# Patient Record
Sex: Male | Born: 1975 | Race: White | Hispanic: No
Health system: Southern US, Community
[De-identification: ages and names within clinical notes are randomized; demographics above are authoritative.]

---

## 2007-01-11 ENCOUNTER — Emergency Department (HOSPITAL_COMMUNITY): Admission: EM | Admit: 2007-01-11 | Discharge: 2007-01-11 | Payer: Self-pay | Admitting: Emergency Medicine

## 2008-06-03 ENCOUNTER — Emergency Department: Payer: Self-pay | Admitting: Unknown Physician Specialty

## 2009-02-23 ENCOUNTER — Ambulatory Visit: Payer: Self-pay | Admitting: Family Medicine

## 2009-06-28 ENCOUNTER — Ambulatory Visit: Payer: Self-pay | Admitting: Internal Medicine

## 2009-07-13 ENCOUNTER — Ambulatory Visit: Payer: Self-pay | Admitting: Internal Medicine

## 2009-07-13 ENCOUNTER — Ambulatory Visit: Payer: Self-pay | Admitting: Family Medicine

## 2010-01-02 ENCOUNTER — Ambulatory Visit: Payer: Self-pay

## 2010-02-23 ENCOUNTER — Ambulatory Visit: Payer: Self-pay | Admitting: Internal Medicine

## 2011-02-19 ENCOUNTER — Ambulatory Visit: Payer: Self-pay | Admitting: Internal Medicine

## 2011-03-24 ENCOUNTER — Ambulatory Visit: Payer: Self-pay | Admitting: Internal Medicine

## 2011-03-25 LAB — CEA: CEA: 1.5 ng/mL (ref 0.0–4.7)

## 2011-04-13 ENCOUNTER — Ambulatory Visit: Payer: Self-pay | Admitting: Internal Medicine

## 2011-11-05 ENCOUNTER — Ambulatory Visit: Payer: Self-pay

## 2011-12-14 ENCOUNTER — Ambulatory Visit: Payer: Self-pay

## 2012-02-25 ENCOUNTER — Emergency Department: Payer: Self-pay | Admitting: Unknown Physician Specialty

## 2012-02-25 LAB — CBC
HGB: 14.9 g/dL (ref 13.0–18.0)
MCHC: 32.2 g/dL (ref 32.0–36.0)
MCV: 88 fL (ref 80–100)
Platelet: 197 10*3/uL (ref 150–440)
RDW: 14.4 % (ref 11.5–14.5)
WBC: 11 10*3/uL — ABNORMAL HIGH (ref 3.8–10.6)

## 2012-02-25 LAB — BASIC METABOLIC PANEL
Anion Gap: 10 (ref 7–16)
Calcium, Total: 9.1 mg/dL (ref 8.5–10.1)
Co2: 24 mmol/L (ref 21–32)
Creatinine: 1.1 mg/dL (ref 0.60–1.30)
EGFR (African American): >60
Potassium: 3.5 mmol/L (ref 3.5–5.1)
Sodium: 140 mmol/L (ref 136–145)

## 2012-02-25 LAB — TROPONIN I: Troponin-I: <0.02 ng/mL

## 2012-02-25 LAB — CK TOTAL AND CKMB (NOT AT ARMC): CK-MB: 1.7 ng/mL (ref 0.5–3.6)

## 2021-12-18 ENCOUNTER — Other Ambulatory Visit: Payer: Self-pay | Admitting: *Deleted

## 2021-12-18 DIAGNOSIS — E7800 Pure hypercholesterolemia, unspecified: Secondary | ICD-10-CM

## 2021-12-23 ENCOUNTER — Other Ambulatory Visit: Payer: Self-pay

## 2021-12-23 ENCOUNTER — Emergency Department (HOSPITAL_COMMUNITY): Payer: BC Managed Care – PPO

## 2021-12-23 ENCOUNTER — Emergency Department (HOSPITAL_COMMUNITY)
Admission: EM | Admit: 2021-12-23 | Discharge: 2021-12-23 | Disposition: A | Discharge status: Home / Self Care | Payer: BC Managed Care – PPO | Attending: Emergency Medicine | Admitting: Emergency Medicine

## 2021-12-23 DIAGNOSIS — G43809 Other migraine, not intractable, without status migrainosus: Secondary | ICD-10-CM

## 2021-12-23 DIAGNOSIS — R531 Weakness: Secondary | ICD-10-CM | POA: Diagnosis present

## 2021-12-23 DIAGNOSIS — Z20822 Contact with and (suspected) exposure to covid-19: Secondary | ICD-10-CM | POA: Insufficient documentation

## 2021-12-23 DIAGNOSIS — R202 Paresthesia of skin: Secondary | ICD-10-CM | POA: Diagnosis not present

## 2021-12-23 LAB — I-STAT CHEM 8, ED
BUN: 26 mg/dL — ABNORMAL HIGH (ref 6–20)
Calcium, Ion: 1.13 mmol/L — ABNORMAL LOW (ref 1.15–1.40)
Chloride: 103 mmol/L (ref 98–111)
Creatinine, Ser: 0.7 mg/dL (ref 0.61–1.24)
Glucose, Bld: 364 mg/dL — ABNORMAL HIGH (ref 70–99)
HCT: 44 % (ref 39.0–52.0)
Hemoglobin: 15 g/dL (ref 13.0–17.0)
Potassium: 5 mmol/L (ref 3.5–5.1)
Sodium: 130 mmol/L — ABNORMAL LOW (ref 135–145)
TCO2: 27 mmol/L (ref 22–32)

## 2021-12-23 LAB — CBC
HCT: 42.8 % (ref 39.0–52.0)
Hemoglobin: 15.9 g/dL (ref 13.0–17.0)
MCH: 32.6 pg (ref 26.0–34.0)
MCHC: 37.1 g/dL — ABNORMAL HIGH (ref 30.0–36.0)
MCV: 87.7 fL (ref 80.0–100.0)
Platelets: 192 10*3/uL (ref 150–400)
RBC: 4.88 MIL/uL (ref 4.22–5.81)
RDW: 13.4 % (ref 11.5–15.5)
WBC: 6 10*3/uL (ref 4.0–10.5)
nRBC: 0 % (ref 0.0–0.2)

## 2021-12-23 LAB — URINALYSIS, ROUTINE W REFLEX MICROSCOPIC
Bacteria, UA: NONE SEEN
Bilirubin Urine: NEGATIVE
Glucose, UA: >=500 mg/dL — AB
Hgb urine dipstick: NEGATIVE
Ketones, ur: 20 mg/dL — AB
Leukocytes,Ua: NEGATIVE
Nitrite: NEGATIVE
Protein, ur: NEGATIVE mg/dL
Specific Gravity, Urine: 1.041 — ABNORMAL HIGH (ref 1.005–1.030)
pH: 5 (ref 5.0–8.0)

## 2021-12-23 LAB — COMPREHENSIVE METABOLIC PANEL
ALT: 39 U/L (ref 0–44)
AST: 66 U/L — ABNORMAL HIGH (ref 15–41)
Albumin: 3.6 g/dL (ref 3.5–5.0)
Alkaline Phosphatase: 78 U/L (ref 38–126)
Anion gap: 14 (ref 5–15)
BUN: 15 mg/dL (ref 6–20)
CO2: 18 mmol/L — ABNORMAL LOW (ref 22–32)
Calcium: 8.9 mg/dL (ref 8.9–10.3)
Chloride: 101 mmol/L (ref 98–111)
Creatinine, Ser: 0.78 mg/dL (ref 0.61–1.24)
GFR, Estimated: >60 mL/min (ref >60)
Glucose, Bld: 321 mg/dL — ABNORMAL HIGH (ref 70–99)
Potassium: 5.1 mmol/L (ref 3.5–5.1)
Sodium: 133 mmol/L — ABNORMAL LOW (ref 135–145)
Total Bilirubin: 2.3 mg/dL — ABNORMAL HIGH (ref 0.3–1.2)
Total Protein: 6.6 g/dL (ref 6.5–8.1)

## 2021-12-23 LAB — DIFFERENTIAL
Abs Immature Granulocytes: 0.04 10*3/uL (ref 0.00–0.07)
Basophils Absolute: 0.1 10*3/uL (ref 0.0–0.1)
Basophils Relative: 1 %
Eosinophils Absolute: 0.1 10*3/uL (ref 0.0–0.5)
Eosinophils Relative: 1 %
Immature Granulocytes: 1 %
Lymphocytes Relative: 27 %
Lymphs Abs: 1.6 10*3/uL (ref 0.7–4.0)
Monocytes Absolute: 0.4 10*3/uL (ref 0.1–1.0)
Monocytes Relative: 7 %
Neutro Abs: 3.8 10*3/uL (ref 1.7–7.7)
Neutrophils Relative %: 63 %

## 2021-12-23 LAB — RESP PANEL BY RT-PCR (FLU A&B, COVID) ARPGX2
Influenza A by PCR: NEGATIVE
Influenza B by PCR: NEGATIVE
SARS Coronavirus 2 by RT PCR: NEGATIVE

## 2021-12-23 LAB — RAPID URINE DRUG SCREEN, HOSP PERFORMED
Amphetamines: NOT DETECTED
Barbiturates: NOT DETECTED
Benzodiazepines: NOT DETECTED
Cocaine: NOT DETECTED
Opiates: NOT DETECTED
Tetrahydrocannabinol: NOT DETECTED

## 2021-12-23 LAB — PROTIME-INR
INR: 0.9 (ref 0.8–1.2)
Prothrombin Time: 12.2 seconds (ref 11.4–15.2)

## 2021-12-23 LAB — CBG MONITORING, ED: Glucose-Capillary: 375 mg/dL — ABNORMAL HIGH (ref 70–99)

## 2021-12-23 LAB — APTT: aPTT: 30 seconds (ref 24–36)

## 2021-12-23 LAB — ETHANOL: Alcohol, Ethyl (B): <10 mg/dL (ref <10)

## 2021-12-23 MED ORDER — DIPHENHYDRAMINE HCL 50 MG/ML IJ SOLN
25.0000 mg | Freq: Once | INTRAMUSCULAR | Status: AC
  Administered 2021-12-23: 25 mg via INTRAVENOUS
  Filled 2021-12-23: qty 1

## 2021-12-23 MED ORDER — IOHEXOL 350 MG/ML SOLN
60.0000 mL | Freq: Once | INTRAVENOUS | Status: AC | PRN
  Administered 2021-12-23: 60 mL via INTRAVENOUS

## 2021-12-23 MED ORDER — SODIUM CHLORIDE 0.9 % IV BOLUS
1000.0000 mL | Freq: Once | INTRAVENOUS | Status: AC
  Administered 2021-12-23: 1000 mL via INTRAVENOUS

## 2021-12-23 MED ORDER — METOCLOPRAMIDE HCL 5 MG/ML IJ SOLN
10.0000 mg | Freq: Once | INTRAMUSCULAR | Status: AC
  Administered 2021-12-23: 10 mg via INTRAVENOUS
  Filled 2021-12-23: qty 2

## 2021-12-23 NOTE — Consult Note (Signed)
Neurology Consultation ? ?Reason for Consult: Code Stroke ?Referring Physician: Dr. Dalene SeltzerSchlossman ? ?CC: Left-sided weakness and paresthesias  ? ?History is obtained from: Patient, chart review ? ?HPI: Seth Diaz is a 46 y.o. male with a medical history significant for non-insulin dependent diabetes mellitus who presented to the ED on 3/13 for evaluation of left-sided weakness, left-sided paresthesias, and a recent history of a 5-day left-sided headache. Patient states that when he went to bed at 21:00 last night, he noted that he had an acute onset of tingling of the left side of his body involving his face, arm, and leg with an ongoing 5 day left-sided headache. He woke up at 06:00 this morning with continued left-sided paresthesias. He states that at 06:45 while he was driving to work, he was unable to hold his left hand up to the steering wheel for driving and decided to go to the ER for further evaluation. CBG for EMS was noted to be elevated at 422. ? ?LKW: 21:00 3/12 ?TNK given?: no, patient presented outside of the thrombolytic therapy time window ?IR Thrombectomy? No, patient's presentation is not consistent with an LVO. Vessel imaging reviewed by attending neurologist and radiologist without evidence of an LVO.  ?Modified Rankin Scale: 0-Completely asymptomatic and back to baseline post- stroke ? ?ROS: A complete ROS was performed and is negative except as noted in the HPI.  ? ? ?Social History:  ? has no history on file for tobacco use, alcohol use, and drug use. ? ?Medications ?No current facility-administered medications for this encounter. ?No current outpatient medications on file. ? ?Exam: ?Current vital signs: ?BP (!) 141/86 (BP Location: Right Arm)   Pulse 89   Temp 98.6 ?F (37 ?C) (Oral)   Resp 14   SpO2 96%  ?Vital signs in last 24 hours: ?Temp:  [98.6 ?F (37 ?C)] 98.6 ?F (37 ?C) (03/13 0908) ?Pulse Rate:  [89] 89 (03/13 0908) ?Resp:  [14] 14 (03/13 0908) ?BP: (141)/(86) 141/86 (03/13  0908) ?SpO2:  [96 %-97 %] 96 % (03/13 0908) ? ?GENERAL: Awake, alert, in no acute distress ?Psych: Affect appropriate for situation, patient is calm and cooperative with examination.  ?Head: Normocephalic and atraumatic, without obvious abnormality ?EENT: Normal conjunctivae, dry mucous membranes, no OP obstruction ?LUNGS: Normal respiratory effort. Non-labored breathing on room air ?CV: Regular rate and rhythm on telemetry ?ABDOMEN: Soft, non-tender, non-distended ?Extremities: Warm, well perfused, without obvious deformity ? ?NEURO:  ?Mental Status: Awake, alert, and oriented to person, place, time, and situation. ?He is able to provide a clear and coherent history of present illness. ?Speech/Language: speech is intact without dysarthria.   ?Naming, repetition, fluency, and comprehension intact without aphasia. ?No neglect is noted ?Cranial Nerves:  ?II: PERRL. Visual fields full.  ?III, IV, VI: EOMI without ptosis, nystagmus, or gaze preference.  ?V: Sensation is intact to light touch with decreased sensation to the left face compared to the right.  ?VII: Face is symmetric resting and smiling.  ?VIII: Hearing is intact to voice ?IX, X: Palate elevation is symmetric. Phonation normal.  ?XI: Normal sternocleidomastoid and trapezius muscle strength ?XII: Tongue protrudes midline without fasciculations.   ?Motor: Right upper and lower extremities are able to elevate antigravity without vertical drift. ?Left upper and lower extremities elevate antigravity with minimal vertical drift on assessment.  ?Tone is normal. Bulk is normal.  ?Sensation: Decreased sensation and tingling paresthesias noted on the left face, arm, and leg.  ?Coordination: FTN intact bilaterally. HKS intact bilaterally. ?Gait: Deferred ? ?  NIHSS: ?1a Level of Conscious.: 0 ?1b LOC Questions: 0 ?1c LOC Commands: 0 ?2 Best Gaze: 0 ?3 Visual: 0 ?4 Facial Palsy: 0 ?5a Motor Arm - left: 1 ?5b Motor Arm - Right: 0 ?6a Motor Leg - Left: 1 ?6b Motor Leg -  Right: 0 ?7 Limb Ataxia: 0 ?8 Sensory: 1 ?9 Best Language: 0 ?10 Dysarthria: 0 ?11 Extinct. and Inatten.: 0 ?TOTAL: 2 ? ?Labs ?I have reviewed labs in epic and the results pertinent to this consultation are: ?CBC ?   ?Component Value Date/Time  ? WBC 6.0 12/23/2021 0914  ? RBC 4.88 12/23/2021 0914  ? HGB 15.0 12/23/2021 0922  ? HGB 14.9 02/25/2012 1844  ? HCT 44.0 12/23/2021 0922  ? HCT 46.4 02/25/2012 1844  ? PLT 192 12/23/2021 0914  ? PLT 197 02/25/2012 1844  ? MCV 87.7 12/23/2021 0914  ? MCV 88 02/25/2012 1844  ? MCH 32.6 12/23/2021 0914  ? MCHC 37.1 (H) 12/23/2021 0914  ? RDW 13.4 12/23/2021 0914  ? RDW 14.4 02/25/2012 1844  ? LYMPHSABS 1.6 12/23/2021 0914  ? MONOABS 0.4 12/23/2021 0914  ? EOSABS 0.1 12/23/2021 0914  ? BASOSABS 0.1 12/23/2021 0914  ? ?CMP  ?   ?Component Value Date/Time  ? NA 130 (L) 12/23/2021 1027  ? NA 140 02/25/2012 1844  ? K 5.0 12/23/2021 0922  ? K 3.5 02/25/2012 1844  ? CL 103 12/23/2021 0922  ? CL 106 02/25/2012 1844  ? CO2 24 02/25/2012 1844  ? GLUCOSE 364 (H) 12/23/2021 2536  ? GLUCOSE 78 02/25/2012 1844  ? BUN 26 (H) 12/23/2021 6440  ? BUN 18 02/25/2012 1844  ? CREATININE 0.70 12/23/2021 0922  ? CREATININE 1.10 02/25/2012 1844  ? CALCIUM 9.1 02/25/2012 1844  ? GFRNONAA >60 02/25/2012 1844  ? GFRAA >60 02/25/2012 1844  ? ?Lipid Panel  ?No results found for: CHOL, TRIG, HDL, CHOLHDL, VLDL, LDLCALC, LDLDIRECT ?No results found for: HGBA1C ? ?Imaging ?I have reviewed the images obtained: ? ?CT-scan of the brain 3/13: ?There is no acute intracranial hemorrhage or evidence of acute infarction. ASPECT score is 10. ?These results were communicated to Dr. Amada Jupiter at 9:28 am on 12/23/2021 by text page via the [facility] messaging system. ? ?CT angio head and neck + CT cerebral perfusion 3/13:  ?No large vessel occlusion, hemodynamically significant stenosis, or evidence of dissection. ? ?Assessment: 46 y.o. male who presented to the ED 3/13 for evaluation of left-sided paresthesias and left-sided  weakness. Patient was last known well prior to bed last night at 21:00 when he had acute onset of left-sided tingling and decreased sensation. Upon driving to work this morning, the patient noted left-sided weakness and presented to the ED for further evaluation. ?- Examination reveals patient with left-sided paresthesias and minimal left-sided weakness. NIHSS of 3. ?- CT imaging is without acute intracranial hemorrhage, LVO, or hemodynamically significant stenosis.  ?- Patient states he does not frequently get headaches but has had a left-sided headache for approximately 5 days prior to hospital arrival.  ?- Presentation is concerning for possible acute ischemic stroke versus less likely complex migraine headache. Will obtain MRI brain for further evaluation. Not a candidate for TNK as his LKW time was 21:00 last night and patient presented to the hospital outside of the thrombolytic therapy window. Not a candidate for IR as presentation and imaging are not consistent with an LVO. ? ?Recommendations: ?- MRI brain wo contrast ?- If MRI brain reveals evidence of acute ischemic stroke, will expand  full stroke work up at that time ? ?Lanae Boast, AGAC-NP ?[facility] ?Pager: [phone] ? ?I have seen the patient and reviewed the above note.  There are several components to his history that are unusual for acute ischemia, including the progressive nature of his symptoms, the presence of positive symptoms (paresthesia), but given that he has not had the symptoms before I think that further evaluation with MRI would be prudent.  If this is negative, I think that complicated migraine is a significant possibility. ? ?He does report a history of occasional headaches, but has never identified them as migraines in the past. ? ?At this time, I would favor getting an MRI of his brain, and if negative treating this as complicated migraine. ? ?Ritta Slot, MD ?[facility] ?[phone] ? ?If  7pm- 7am, please page neurology on call as listed in AMION. ? ?

## 2021-12-23 NOTE — Code Documentation (Signed)
Stroke Response Nurse Documentation ?Code Documentation ? ?DEMONIE KASSA is a 46 y.o. male arriving to [facility]  via [facility] on 12/23/2021 with past medical hx of non-insulin dependent diabetes. CBG with EMS 422. CBG in ED 375. Code stroke was activated by ED.  ? ?Patient from home where he was LKW at 1900 12/22/2021 when he noted tingling on left side. Woke up this morning to increase in tingling. Attempted to drive to work and had difficulty with left arm on the steering wheel. Reports recent left sided headache that has now resolved, but complains of dull feeling on left side of head. Also reports intermittently leaning to the side when ambulating for the past few weeks.   ? ?Stroke team met patient in CT, cleared by Dr. Billy Fischer. NIHSS 3, see documentation for details and code stroke times. Patient with left arm weakness, left leg weakness, and left decreased sensation on exam. The following imaging was completed:  CT Head and CTA. Patient is not a candidate for IV Thrombolytic due to out of window. Patient is not not a candidate for IR due to no LVO.  ? ?Care Plan: Q2h assessments; MRI will determine plan.  ? ?Bedside handoff with ED RN Jacquelyn.   ? ?Leverne Humbles ?Stroke Response RN ? ? ?

## 2021-12-23 NOTE — ED Provider Notes (Signed)
[facility]   CSN: [id number] Arrival date & time: 12/23/21  [id number]  An emergency department physician performed an initial assessment on this suspected stroke patient at 0903.  History  Chief Complaint  Patient presents with   Headache   Numbness    Seth Diaz is a 46 y.o. male with a past medical history of NIDDM presenting to the ED with a chief complaint of weakness.  States that he was in his usual state of health when he went to sleep at 9 PM yesterday.  He woke up this morning and noticed that "something felt off, and it was tingling" about the left side of his body.  At approximately 6:45 AM he was driving to work when he noticed weakness in his left arm to the point where he "could not even lift up my arm to drive."  He states that the symptoms have been persistent since they began.  He does admit that he has had a left-sided headache for the past few days and has intermittently been taking Tylenol.  He also noticed abnormal gait and "leaning" to the side when he ambulates for the past few weeks.  He denies history of similar symptoms in the past.  He denies any vision changes, loss of vision, numbness, chest pain, shortness of breath, injuries or falls, history of stroke or hemorrhage.   Headache Associated symptoms: weakness   Associated symptoms: no abdominal pain, no cough, no diarrhea, no dizziness, no ear pain, no fever, no myalgias, no nausea, no photophobia, no sore throat and no vomiting       Home Medications Prior to Admission medications   Not on File      Allergies    Patient has no known allergies.    Review of Systems   Review of Systems  Constitutional:  Negative for appetite change, chills and fever.  HENT:  Negative for ear pain, rhinorrhea, sneezing and sore throat.   Eyes:  Negative for photophobia and visual disturbance.  Respiratory:  Negative for cough, chest tightness, shortness of breath  and wheezing.   Cardiovascular:  Negative for chest pain and palpitations.  Gastrointestinal:  Negative for abdominal pain, blood in stool, constipation, diarrhea, nausea and vomiting.  Genitourinary:  Negative for dysuria, hematuria and urgency.  Musculoskeletal:  Negative for myalgias.  Skin:  Negative for rash.  Neurological:  Positive for weakness and headaches. Negative for dizziness and light-headedness.   Physical Exam Updated Vital Signs BP 123/83    Pulse 80    Temp 98.8 F (37.1 C) (Oral)    Resp 18    Ht 6' (1.829 m)    Wt 98.9 kg    SpO2 96%    BMI 29.57 kg/m  Physical Exam Vitals and nursing note reviewed.  Constitutional:      General: He is not in acute distress.    Appearance: He is well-developed.  HENT:     Head: Normocephalic and atraumatic.     Nose: Nose normal.  Eyes:     General: No scleral icterus.       Left eye: No discharge.     Conjunctiva/sclera: Conjunctivae normal.  Cardiovascular:     Rate and Rhythm: Normal rate and regular rhythm.     Heart sounds: Normal heart sounds. No murmur heard.   No friction rub. No gallop.  Pulmonary:     Effort: Pulmonary effort is normal. No respiratory distress.     Breath sounds:  Normal breath sounds.  Abdominal:     General: Bowel sounds are normal. There is no distension.     Palpations: Abdomen is soft.     Tenderness: There is no abdominal tenderness. There is no guarding.  Musculoskeletal:        General: Normal range of motion.     Cervical back: Normal range of motion and neck supple.  Skin:    General: Skin is warm and dry.     Findings: No rash.  Neurological:     General: No focal deficit present.     Mental Status: He is alert and oriented to person, place, and time.     Sensory: No sensory deficit.     Motor: Weakness present. No abnormal muscle tone.     Coordination: Coordination normal.     Comments: Strength 3/5 of left upper and lower extremity.  Normal sensation to light touch of  bilateral upper and lower extremities.  No facial asymmetry noted.  Visual fields are intact.  Symmetrical tongue protrusion.  No aphasia.    ED Results / Procedures / Treatments   Labs (all labs ordered are listed, but only abnormal results are displayed) Labs Reviewed  CBC - Abnormal; Notable for the following components:      Result Value   MCHC 37.1 (*)    All other components within normal limits  URINALYSIS, ROUTINE W REFLEX MICROSCOPIC - Abnormal; Notable for the following components:   Specific Gravity, Urine 1.041 (*)    Glucose, UA >=500 (*)    Ketones, ur 20 (*)    All other components within normal limits  COMPREHENSIVE METABOLIC PANEL - Abnormal; Notable for the following components:   Sodium 133 (*)    CO2 18 (*)    Glucose, Bld 321 (*)    AST 66 (*)    Total Bilirubin 2.3 (*)    All other components within normal limits  I-STAT CHEM 8, ED - Abnormal; Notable for the following components:   Sodium 130 (*)    BUN 26 (*)    Glucose, Bld 364 (*)    Calcium, Ion 1.13 (*)    All other components within normal limits  CBG MONITORING, ED - Abnormal; Notable for the following components:   Glucose-Capillary 375 (*)    All other components within normal limits  RESP PANEL BY RT-PCR (FLU A&B, COVID) ARPGX2  ETHANOL  PROTIME-INR  APTT  DIFFERENTIAL  RAPID URINE DRUG SCREEN, HOSP PERFORMED    EKG EKG Interpretation  Date/Time:  Monday December 23 2021 09:08:02 EDT Ventricular Rate:  79 PR Interval:  165 QRS Duration: 98 QT Interval:  383 QTC Calculation: 439 R Axis:   81 Text Interpretation: Sinus rhythm No significant change since last tracing Confirmed by Alvira Monday ([id number]) on 12/23/2021 10:31:21 AM  Radiology MR BRAIN WO CONTRAST  Result Date: 12/23/2021 CLINICAL DATA:  Neuro deficit with acute stroke suspected EXAM: MRI HEAD WITHOUT CONTRAST TECHNIQUE: Multiplanar, multiecho pulse sequences of the brain and surrounding structures were obtained without  intravenous contrast. COMPARISON:  Head CT and CTA from earlier today FINDINGS: Brain: No acute infarction, hemorrhage, hydrocephalus, extra-axial collection or mass lesion. Few remote white matter insults mainly seen in the frontal white matter. No specific demyelinating pattern. Normal brain volume. This is a nonspecific finding which can be seen with migraine, trauma, inflammation, or ischemia. History of diabetes favors early chronic small vessel ischemia. Vascular: Normal flow voids. Skull and upper cervical spine: Normal marrow signal. Sinuses/Orbits:  Negative. IMPRESSION: 1. No acute finding. 2. Few remote white matter insults with nonspecific pattern, diabetic history favoring early small vessel ischemia. Electronically Signed   By: Tiburcio Pea M.D.   On: 12/23/2021 11:49   CT HEAD CODE STROKE WO CONTRAST  Result Date: 12/23/2021 CLINICAL DATA:  Code stroke.  Neuro deficit, acute, stroke suspected EXAM: CT HEAD WITHOUT CONTRAST TECHNIQUE: Contiguous axial images were obtained from the base of the skull through the vertex without intravenous contrast. RADIATION DOSE REDUCTION: This exam was performed according to the departmental dose-optimization program which includes automated exposure control, adjustment of the mA and/or kV according to patient size and/or use of iterative reconstruction technique. COMPARISON:  None. FINDINGS: Brain: There is no acute intracranial hemorrhage, mass effect, or edema. Gray-white differentiation is preserved. There is no extra-axial fluid collection. Ventricles and sulci are within normal limits in size and configuration. Vascular: No hyperdense vessel. Skull: Calvarium is unremarkable. Sinuses/Orbits: No acute finding. Other: None. ASPECTS ([place]berta Stroke Program Early CT Score) - Ganglionic level infarction (caudate, lentiform nuclei, internal capsule, insula, M1-M3 cortex): 7 - Supraganglionic infarction (M4-M6 cortex): 3 Total score (0-10 with 10 being normal): 10  IMPRESSION: There is no acute intracranial hemorrhage or evidence of acute infarction. ASPECT score is 10. These results were communicated to Dr. Amada Jupiter at 9:28 am on 12/23/2021 by text page via the [facility] messaging system. Electronically Signed   By: Guadlupe Spanish M.D.   On: 12/23/2021 09:29   CT ANGIO HEAD NECK W WO CM (CODE STROKE)  Result Date: 12/23/2021 CLINICAL DATA:  Neuro deficit, acute, stroke suspected EXAM: CT ANGIOGRAPHY HEAD AND NECK TECHNIQUE: Multidetector CT imaging of the head and neck was performed using the standard protocol during bolus administration of intravenous contrast. Multiplanar CT image reconstructions and MIPs were obtained to evaluate the vascular anatomy. Carotid stenosis measurements (when applicable) are obtained utilizing NASCET criteria, using the distal internal carotid diameter as the denominator. RADIATION DOSE REDUCTION: This exam was performed according to the departmental dose-optimization program which includes automated exposure control, adjustment of the mA and/or kV according to patient size and/or use of iterative reconstruction technique. CONTRAST:  47mL OMNIPAQUE IOHEXOL 350 MG/ML SOLN COMPARISON:  None. FINDINGS: CTA NECK Aortic arch: Great vessel origins are patent. Right carotid system: Patent.  No stenosis. Left carotid system: Patent.  No stenosis. Vertebral arteries: Patent and codominant.  No stenosis. Skeleton: No significant osseous abnormality. Other neck: Unremarkable. Upper chest: No apical lung mass. Review of the MIP images confirms the above findings CTA HEAD Anterior circulation: Intracranial internal carotid arteries are patent. Anterior and middle cerebral arteries are patent. Posterior circulation: Intracranial vertebral arteries are patent. Basilar artery is patent. Major cerebellar artery origins are patent. Bilateral posterior communicating arteries are present. Posterior cerebral arteries are patent with hypoplastic P1 segments. Venous  sinuses: Patent as allowed by contrast bolus timing. Review of the MIP images confirms the above findings IMPRESSION: No large vessel occlusion, hemodynamically significant stenosis, or evidence of dissection. Electronically Signed   By: Guadlupe Spanish M.D.   On: 12/23/2021 09:54    Procedures Procedures    Medications Ordered in ED Medications  iohexol (OMNIPAQUE) 350 MG/ML injection 60 mL (60 mLs Intravenous Contrast Given 12/23/21 0940)  metoCLOPramide (REGLAN) injection 10 mg (10 mg Intravenous Given 12/23/21 1223)  diphenhydrAMINE (BENADRYL) injection 25 mg (25 mg Intravenous Given 12/23/21 1223)  sodium chloride 0.9 % bolus 1,000 mL (0 mLs Intravenous Stopped 12/23/21 1323)    ED Course/ Medical Decision  Making/ A&P Clinical Course as of 12/23/21 1453  Mon Dec 23, 2021  0924 Glucose-Capillary(!): 375 [HK]  1443 Alcohol, Ethyl (B): <10 [HK]  1443 Potassium: 5.1 [HK]  1443 Glucose, UA(!): >=500 [HK]  1452 Amphetamines: NONE DETECTED [HK]    Clinical Course User Index [HK] Dietrich Pates, PA-C                           Medical Decision Making Amount and/or Complexity of Data Reviewed Labs: ordered. Decision-making details documented in ED Course. Radiology: ordered.  Risk Prescription drug management.   46 year old male presenting to the ED for what appears to be acute onset of left-sided upper and lower extremity weakness.  Went to sleep last night at 9 PM in his usual state of health.  He noticed feeling "off" on left side of his body when he woke up but noticed significant weakness in his left arm while he was driving to work at 1:61 AM to the point where he had trouble lifting his arm to drive.  He does have significant weakness in left upper and lower extremities on exam.  He has no visual field deficits, no aphasia or facial asymmetry.  Due to timeline of symptom onset a code stroke was called.  CT of the head, CT angio of the head and neck revealed no emergent or concerning  findings.  He was evaluated by neurology at the bedside who ordered an MRI.  Lab work was also ordered, he is hyperglycemic here but no evidence of DKA.  Ethanol level is normal.  Urinalysis without evidence of infection.  CBC is unremarkable.  MRI of the brain shows no abnormalities.  Neurology recommended treatment for migraine as this could be due to a complicated migraine.  On recheck after migraine cocktail patient's symptoms have significantly improved.  He has normal strength and sensation in his upper and lower extremities. He is ambulating without difficulty here and has a normal gait. No evidence of stroke, hemorrhage, or other emergent cause of his symptoms.  Patient is agreeable to discharge home with outpatient follow-up.  Return precautions given   Patient is hemodynamically stable, in NAD, and able to ambulate in the ED. Evaluation does not show pathology that would require ongoing emergent intervention or inpatient treatment. I explained the diagnosis to the patient. Pain has been managed and has no complaints prior to discharge. Patient is comfortable with above plan and is stable for discharge at this time. All questions were answered prior to disposition. Strict return precautions for returning to the ED were discussed. Encouraged follow up with PCP.   An After Visit Summary was printed and given to the patient.   Portions of this note were generated with Scientist, clinical (histocompatibility and immunogenetics). Dictation errors may occur despite best attempts at proofreading.         Final Clinical Impression(s) / ED Diagnoses Final diagnoses:  Other migraine without status migrainosus, not intractable    Rx / DC Orders ED Discharge Orders     None         Dietrich Pates, PA-C 12/23/21 1453    Alvira Monday, MD 12/23/21 2326

## 2021-12-23 NOTE — ED Notes (Signed)
Pt transported to MRI 

## 2021-12-23 NOTE — Discharge Instructions (Signed)
Follow-up with your primary care provider and the specialist listed below. ?Return to the ER if you start to experience worsening symptoms, severe headache, blurry vision, numbness or weakness in your arms or legs. ?

## 2021-12-23 NOTE — ED Triage Notes (Signed)
Pt arrived from work by EMS ?Pt went to bed and 9pm at his baseline. Woke up and felt weakness on his right side and 6am, pt has had a left sided headache x5days. Pt went to work and didn't improved so he called his provider and they told him to go to ED. ? ?No blood thinners, denies trauma  ?CBG 422 by EMS pt states blood sugar has been running high.  ? ?

## 2022-01-10 ENCOUNTER — Ambulatory Visit
Admission: RE | Admit: 2022-01-10 | Discharge: 2022-01-10 | Disposition: A | Discharge status: Home / Self Care | Payer: No Typology Code available for payment source | Source: Ambulatory Visit | Attending: *Deleted | Admitting: *Deleted

## 2022-01-10 DIAGNOSIS — E7800 Pure hypercholesterolemia, unspecified: Secondary | ICD-10-CM

## 2023-01-21 IMAGING — CT CT HEAD CODE STROKE
3 series · 15 of 47 positions shown, 18 images · non-contrast
Comparison: None.

CLINICAL DATA: Code stroke.  Neuro deficit, acute, stroke suspected



[Series 4: head 5.0 h30s · axial · 0.46mm/px · z∈[-144,-4]mm · 9 of 34 slices shown, 12 images]
[im 3/34  brain]
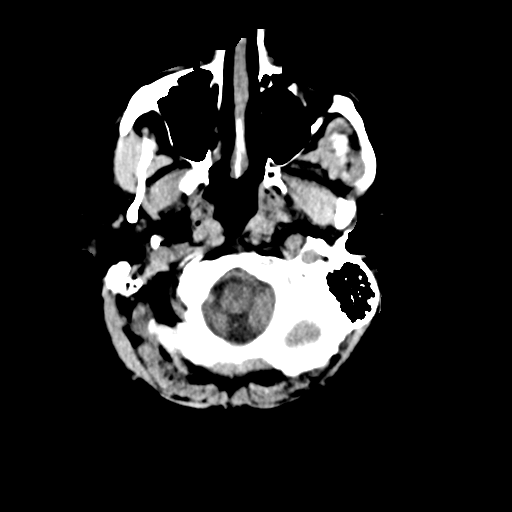
[im 3/34  bone]
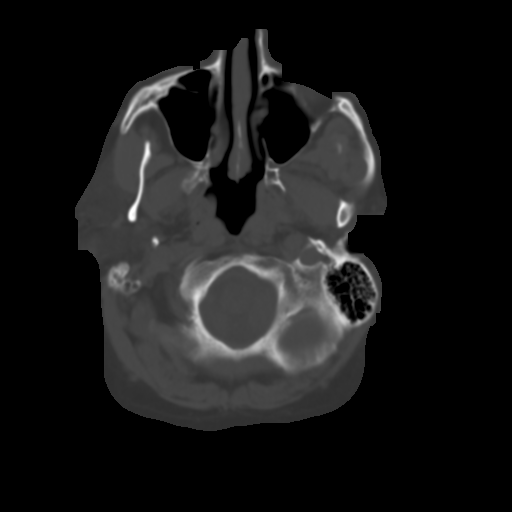
[im 6/34  brain]
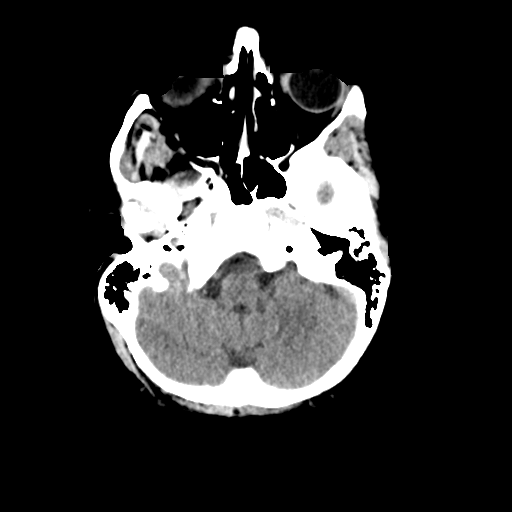
[im 10/34  brain]
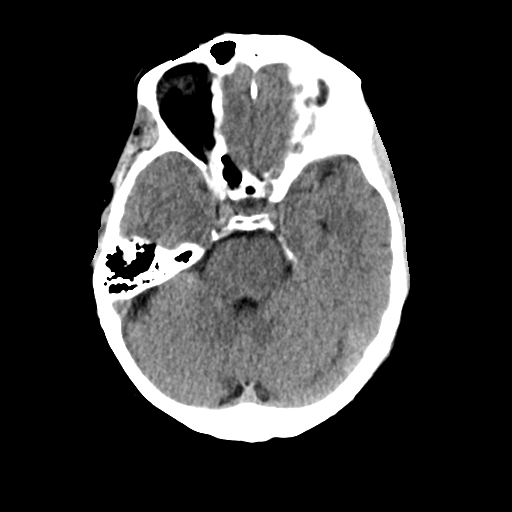
[im 13/34  brain]
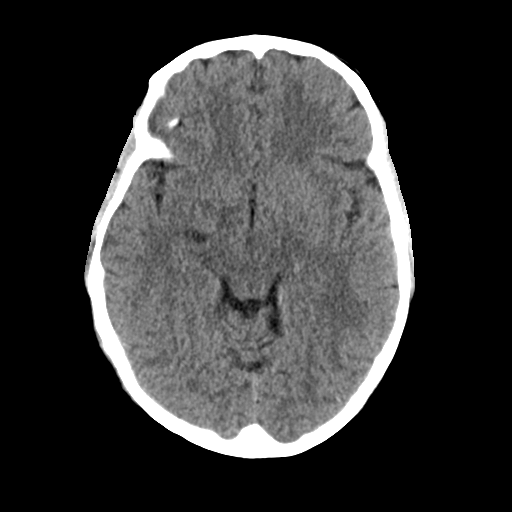
[im 18/34  brain]
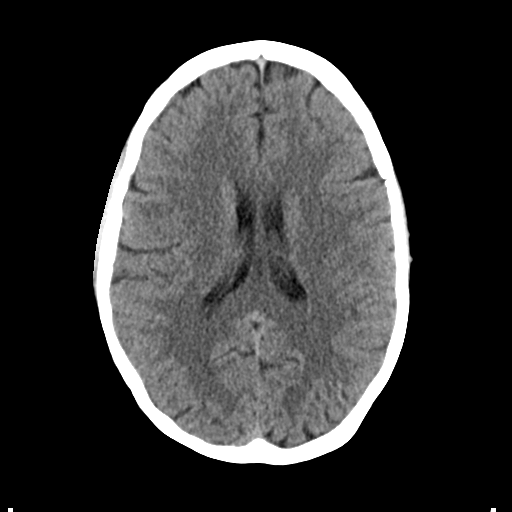
[im 18/34  bone]
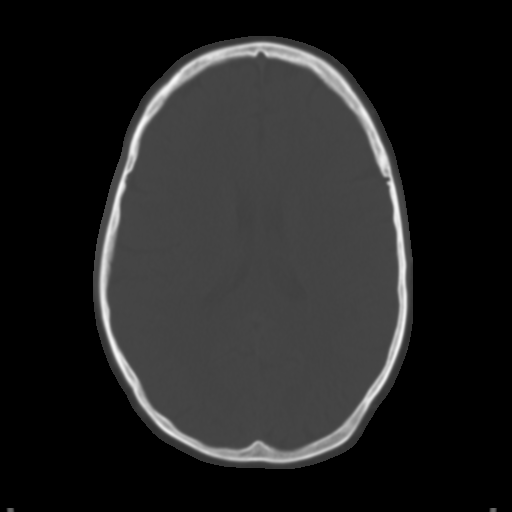
[im 21/34  brain]
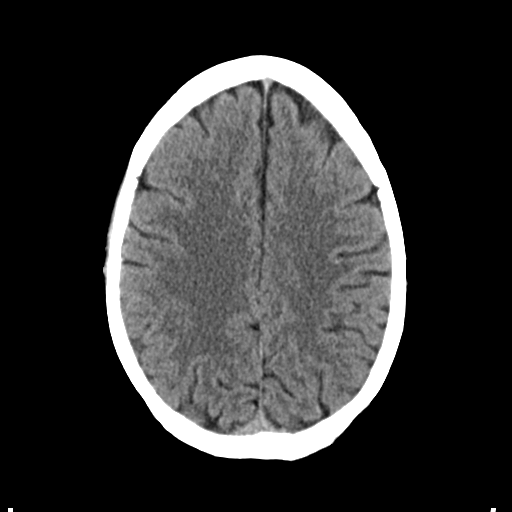
[im 24/34  brain]
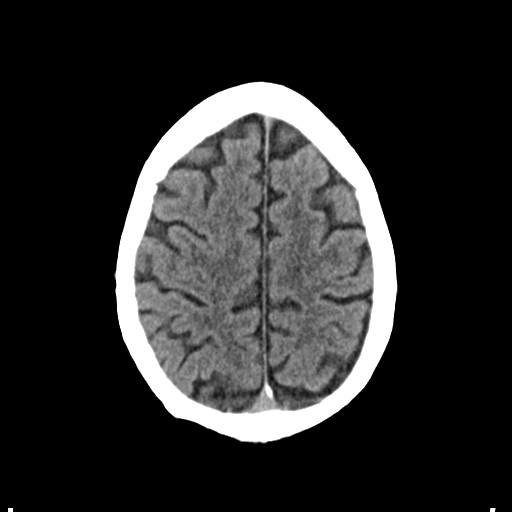
[im 28/34  brain]
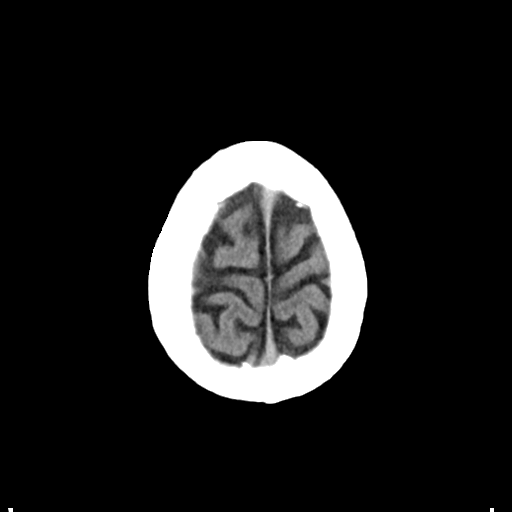
[im 31/34  brain]
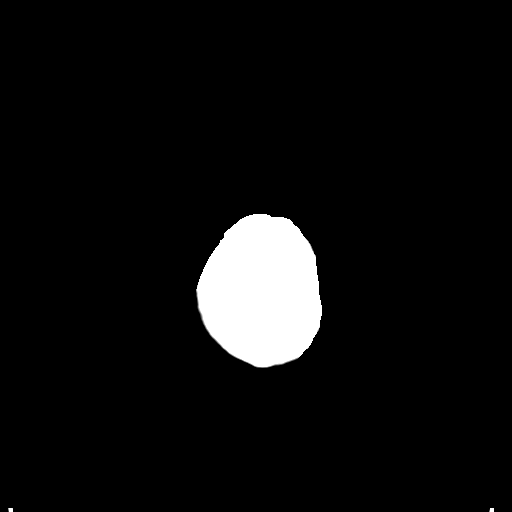
[im 31/34  bone]
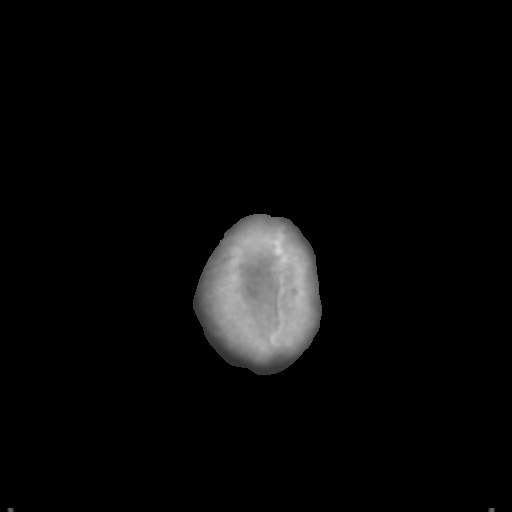

[Series 5: head 3.0 mpr cor · coronal · 0.33mm/px · 3 of 78 slices shown]
[im 26/78  brain]
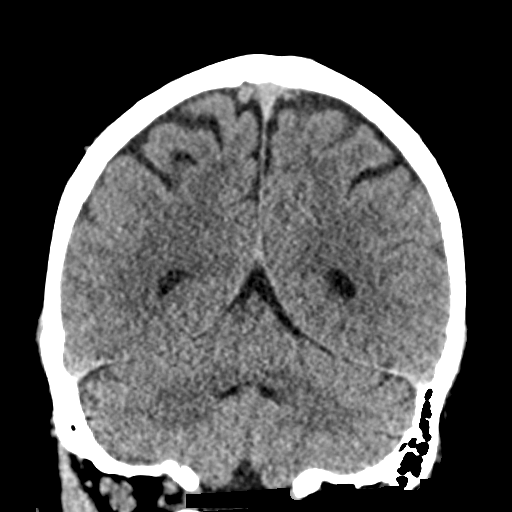
[im 35/78  brain]
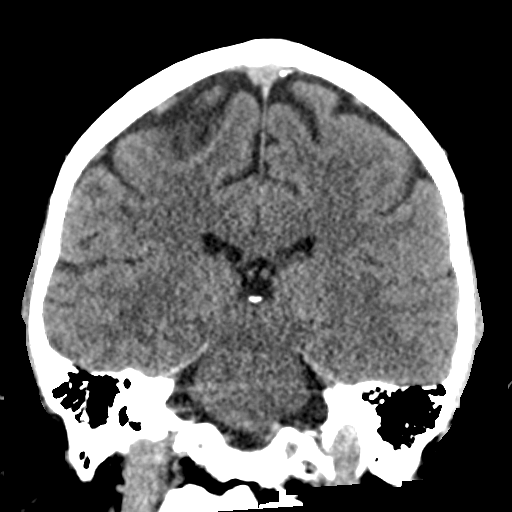
[im 43/78  brain]
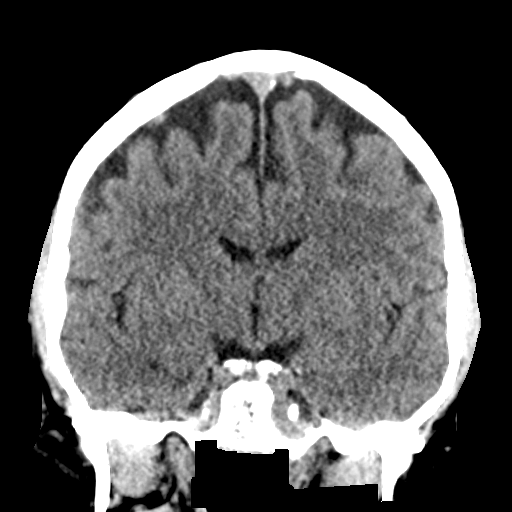

[Series 6: head 3.0 mpr sag · sagittal · 0.33mm/px · 3 of 57 slices shown]
[im 19/57  brain]
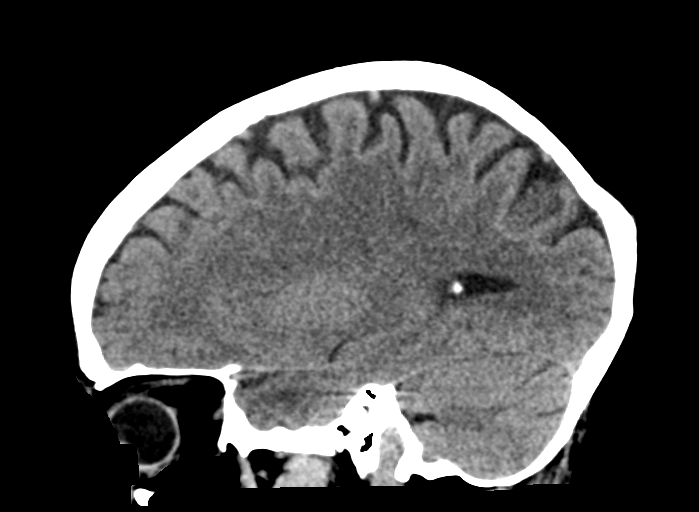
[im 29/57  brain]
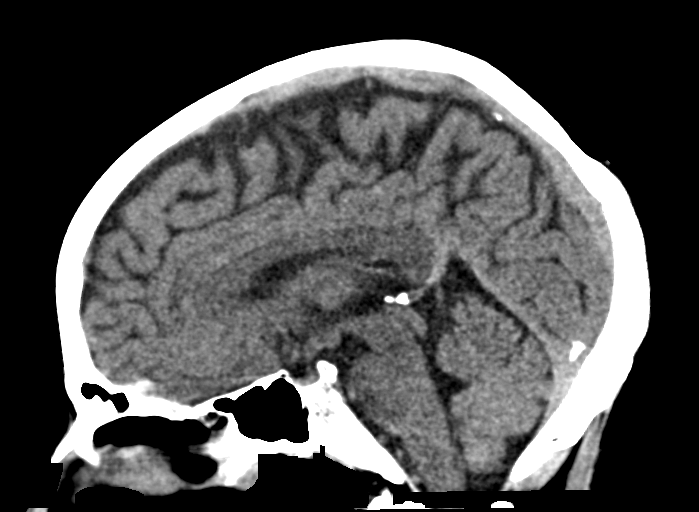
[im 38/57  brain]
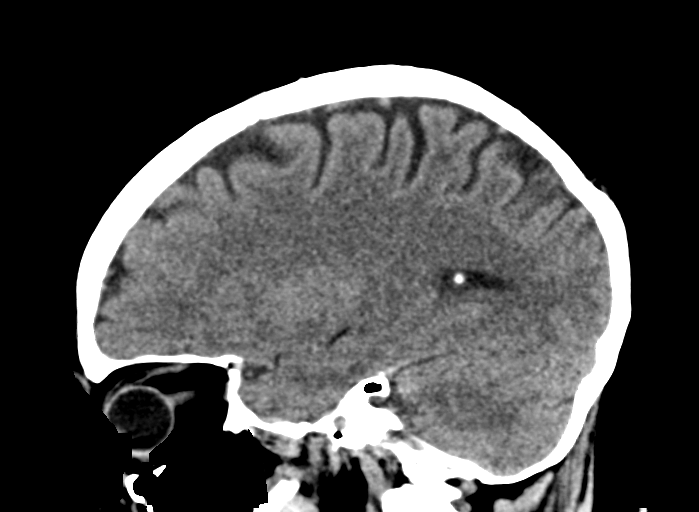

[15 of 47 positions shown; findings below may reference images not displayed]

FINDINGS: Brain: There is no acute intracranial hemorrhage, mass effect, or
edema. Gray-white differentiation is preserved. There is no
extra-axial fluid collection. Ventricles and sulci are within normal
limits in size and configuration.

Vascular: No hyperdense vessel.

Skull: Calvarium is unremarkable.

Sinuses/Orbits: No acute finding.

Other: None.

ASPECTS ([place] Stroke Program Early CT Score)

- Ganglionic level infarction (caudate, lentiform nuclei, internal
capsule, insula, M1-M3 cortex): 7

- Supraganglionic infarction (M4-M6 cortex): 3

Total score (0-10 with 10 being normal): 10
IMPRESSION: There is no acute intracranial hemorrhage or evidence of acute
infarction. ASPECT score is 10.

These results were communicated to Dr. Mayalu at [DATE] on
12/23/2021 by text page via the [employer] messaging system.

## 2023-01-21 IMAGING — MR MR HEAD W/O CM
9 of 10 series · 39 of 48 positions shown · non-contrast
Comparison: Head CT and CTA from earlier today

CLINICAL DATA: Neuro deficit with acute stroke suspected

EXAM:
MRI HEAD WITHOUT CONTRAST
TECHNIQUE: Multiplanar, multiecho pulse sequences of the brain and surrounding
structures were obtained without intravenous contrast.

[Series 4: DWI · axial · 3.0mm · 1.09mm/px · z∈[-97,+58]mm · 9 of 108 slices shown (1 of 4)]
[im 1/108]
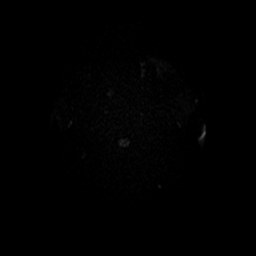
[im 14/108]
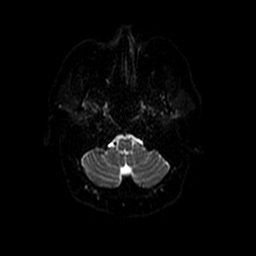
[im 27/108]
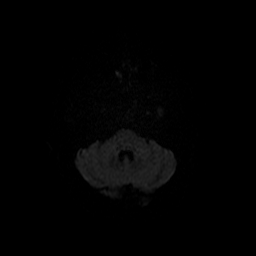
[im 41/108]
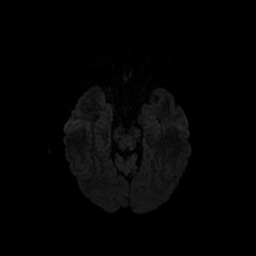
[im 54/108]
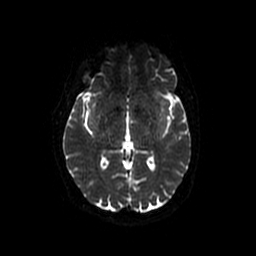
[im 67/108]
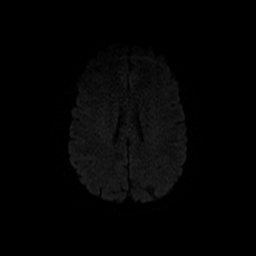
[im 81/108]
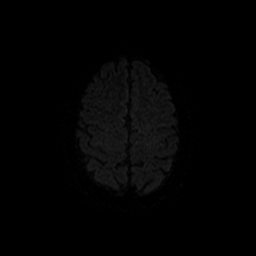
[im 94/108]
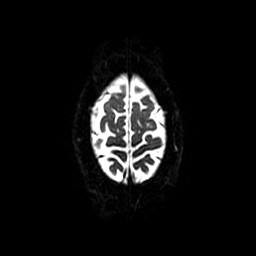
[im 108/108]
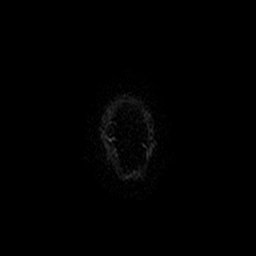

[Series 5: DWI · coronal · 5.0mm · 1.09mm/px · 7 of 80 slices shown (2 of 4)]
[im 1/80]
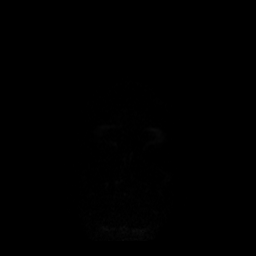
[im 14/80]
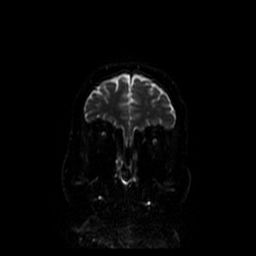
[im 27/80]
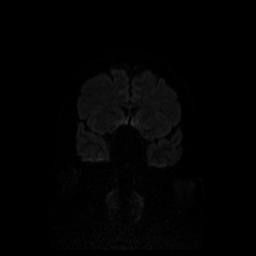
[im 40/80]
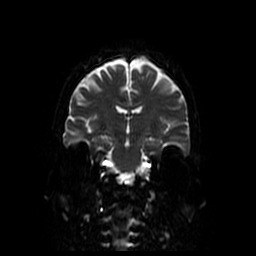
[im 53/80]
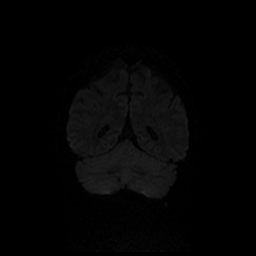
[im 66/80]
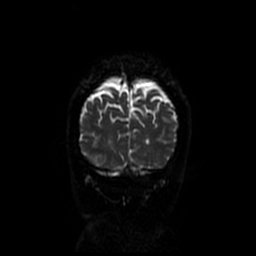
[im 80/80]
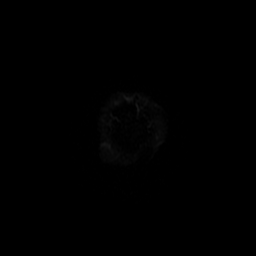

[Series 6: T1 · sagittal · 5.0mm · 0.47mm/px · 2 of 27 slices shown (1 of 2)]
[im 1/27]
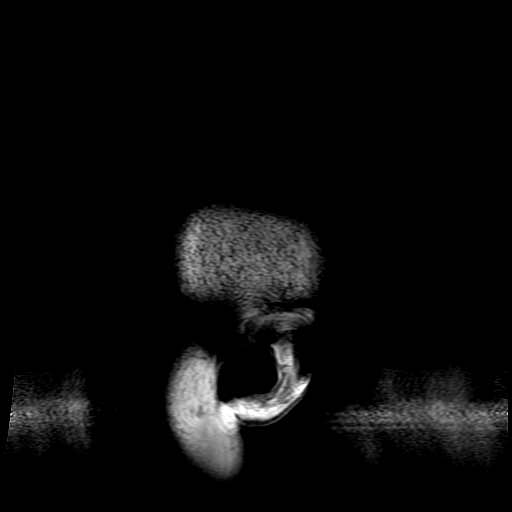
[im 27/27]
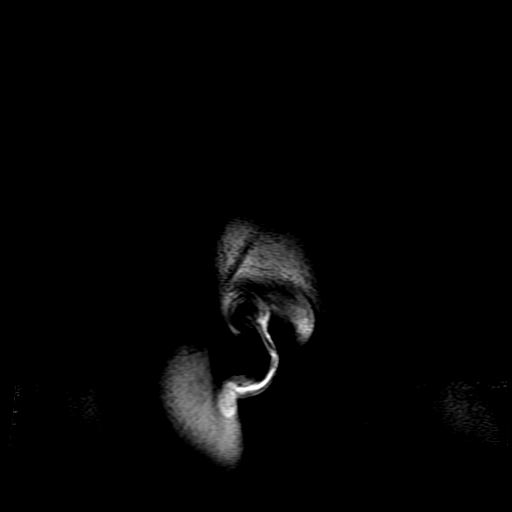

[Series 7: T2 · axial · 5.0mm · 0.43mm/px · z∈[-84,+75]mm · 3 of 28 slices shown (1 of 2)]
[im 1/28]
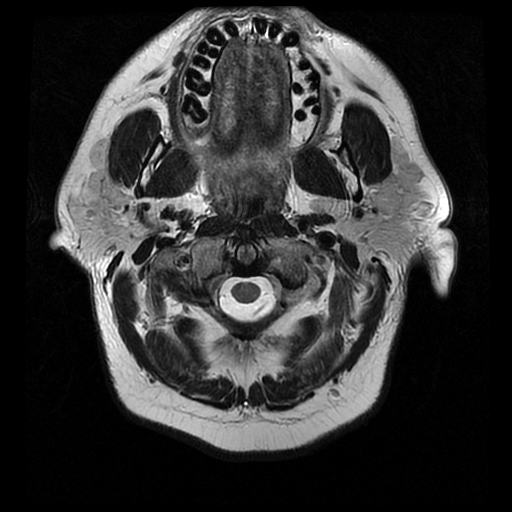
[im 14/28]
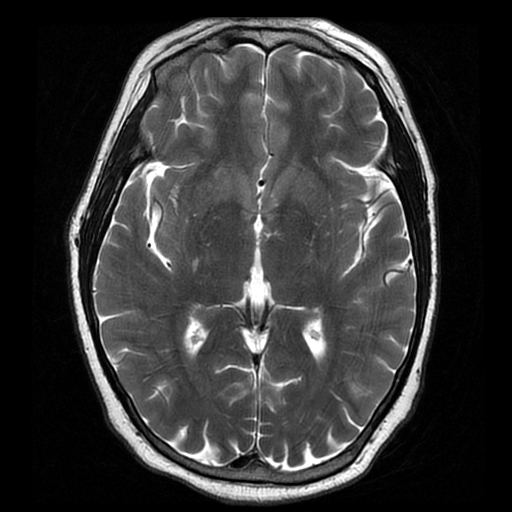
[im 28/28]
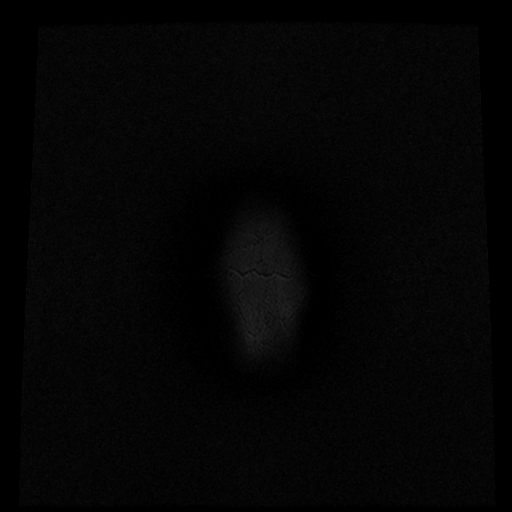

[Series 8: FLAIR · axial · 3.0mm · 0.43mm/px · z∈[-90,+68]mm · 3 of 28 slices shown]
[im 1/28]
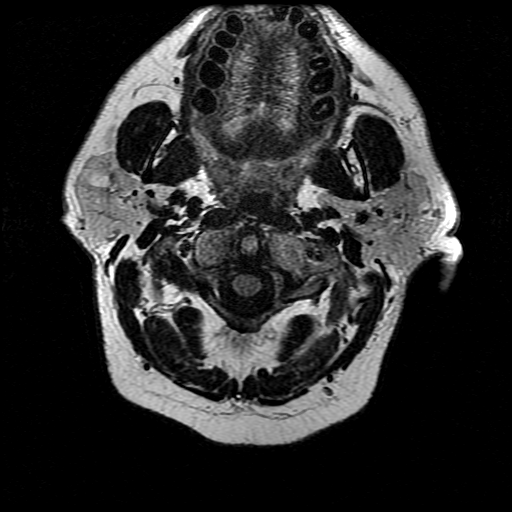
[im 14/28]
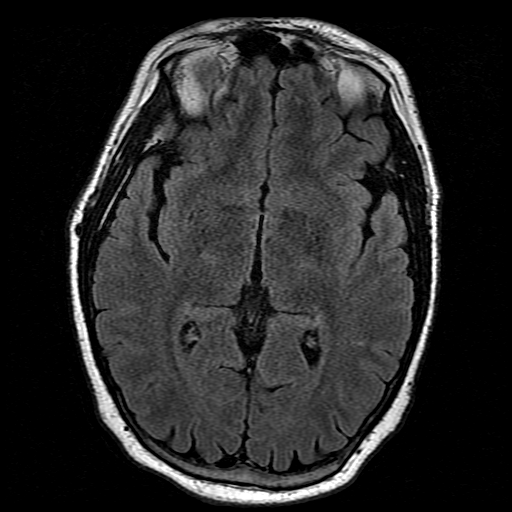
[im 28/28]
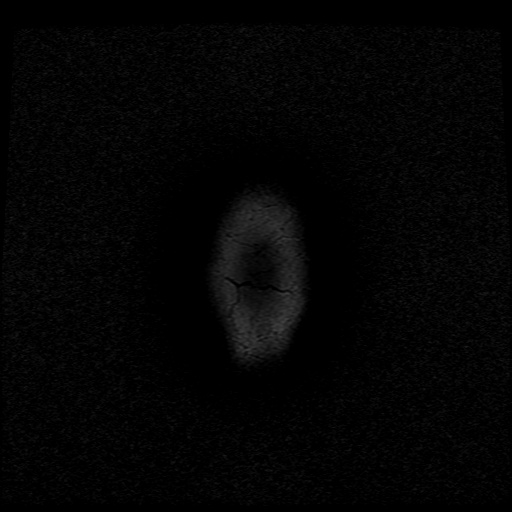

[Series 10: T1 · axial · 3.0mm · 0.47mm/px · z∈[-86,-34]mm · 3 of 108 slices shown (2 of 2)]
[im 1/108]
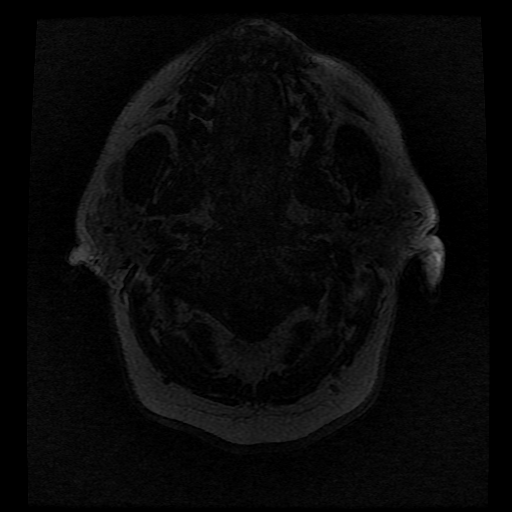
[im 12/108]
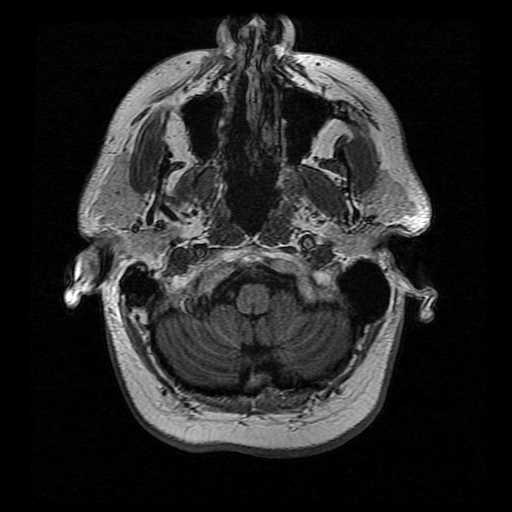
[im 36/108]
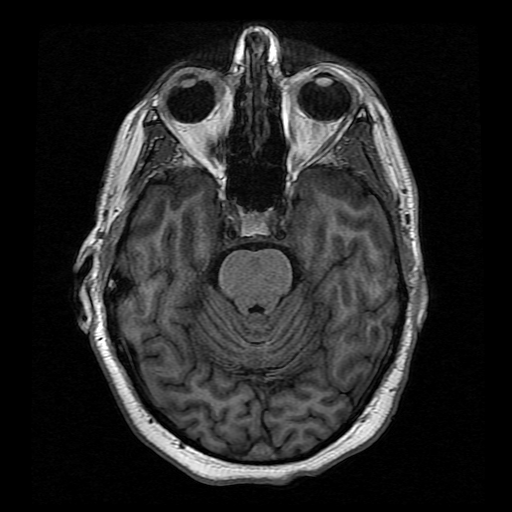

[Series 11: T2 · coronal · 5.0mm · 0.78mm/px · 3 of 30 slices shown (2 of 2)]
[im 1/30]
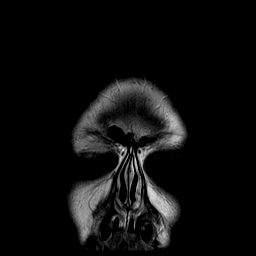
[im 15/30]
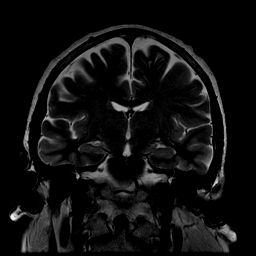
[im 30/30]
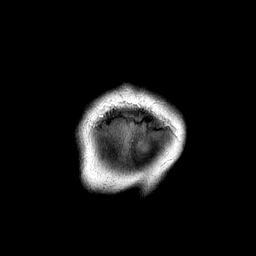

[Series 400: DWI · axial · 3.0mm · 1.09mm/px · z∈[-97,+58]mm · 5 of 54 slices shown (3 of 4)]
[im 1/54]
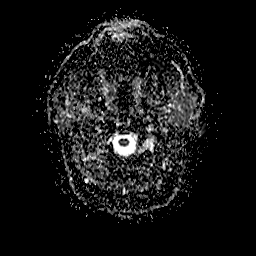
[im 14/54]
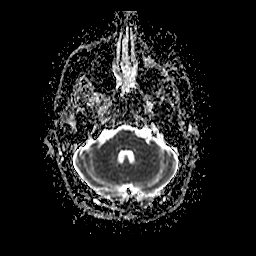
[im 27/54]
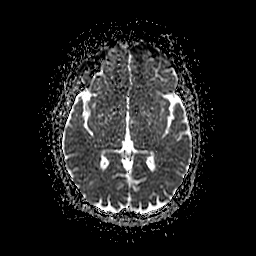
[im 40/54]
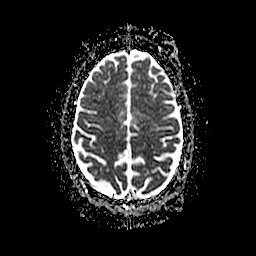
[im 54/54]
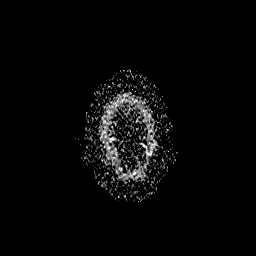

[Series 500: DWI · coronal · 5.0mm · 1.09mm/px · 4 of 40 slices shown (4 of 4)]
[im 1/40]
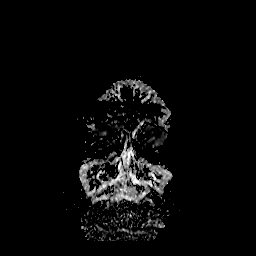
[im 14/40]
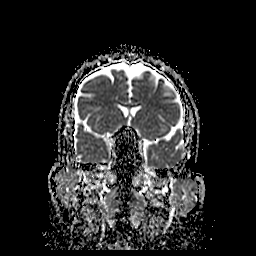
[im 27/40]
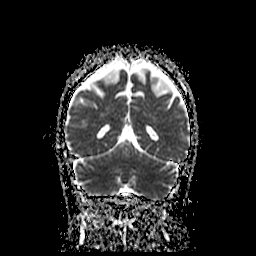
[im 40/40]
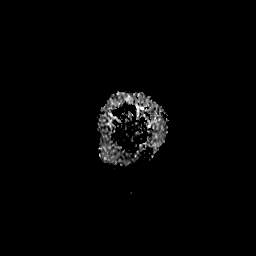

[39 of 48 positions shown; findings below may reference images not displayed]

FINDINGS: Brain: No acute infarction, hemorrhage, hydrocephalus, extra-axial
collection or mass lesion. Few remote white matter insults mainly
seen in the frontal white matter. No specific demyelinating pattern.
Normal brain volume. This is a nonspecific finding which can be seen
with migraine, trauma, inflammation, or ischemia. History of
diabetes favors early chronic small vessel ischemia.

Vascular: Normal flow voids.

Skull and upper cervical spine: Normal marrow signal.

Sinuses/Orbits: Negative.
IMPRESSION: 1. No acute finding.
2. Few remote white matter insults with nonspecific pattern,
diabetic history favoring early small vessel ischemia.

## 2023-01-27 ENCOUNTER — Other Ambulatory Visit: Payer: Self-pay | Admitting: *Deleted

## 2023-01-27 DIAGNOSIS — M25562 Pain in left knee: Secondary | ICD-10-CM
